# Patient Record
Sex: Male | Born: 1955 | Race: White | Hispanic: No | Marital: Single | Smoking: Current every day smoker
Health system: Southern US, Community
[De-identification: ages and names within clinical notes are randomized; demographics above are authoritative.]

## PROBLEM LIST (undated history)

## (undated) DIAGNOSIS — I10 Essential (primary) hypertension: Secondary | ICD-10-CM

## (undated) HISTORY — DX: Essential (primary) hypertension: I10

---

## 2008-02-24 ENCOUNTER — Emergency Department (HOSPITAL_BASED_OUTPATIENT_CLINIC_OR_DEPARTMENT_OTHER): Admission: EM | Admit: 2008-02-24 | Discharge: 2008-02-24 | Payer: Self-pay | Admitting: Emergency Medicine

## 2013-02-22 ENCOUNTER — Ambulatory Visit (INDEPENDENT_AMBULATORY_CARE_PROVIDER_SITE_OTHER): Payer: BC Managed Care – PPO | Admitting: Family Medicine

## 2013-02-22 VITALS — BP 160/90 | HR 69 | Temp 97.6°F | Resp 16 | Ht 63.5 in | Wt 171.4 lb

## 2013-02-22 DIAGNOSIS — Z87891 Personal history of nicotine dependence: Secondary | ICD-10-CM

## 2013-02-22 DIAGNOSIS — M1710 Unilateral primary osteoarthritis, unspecified knee: Secondary | ICD-10-CM

## 2013-02-22 DIAGNOSIS — I10 Essential (primary) hypertension: Secondary | ICD-10-CM

## 2013-02-22 DIAGNOSIS — M79609 Pain in unspecified limb: Secondary | ICD-10-CM

## 2013-02-22 DIAGNOSIS — M79604 Pain in right leg: Secondary | ICD-10-CM

## 2013-02-22 LAB — POCT UA - MICROSCOPIC ONLY
Bacteria, U Microscopic: NEGATIVE
Crystals, Ur, HPF, POC: NEGATIVE
RBC, urine, microscopic: NEGATIVE
WBC, Ur, HPF, POC: NEGATIVE

## 2013-02-22 LAB — POCT URINALYSIS DIPSTICK
Bilirubin, UA: NEGATIVE
Blood, UA: NEGATIVE
Glucose, UA: NEGATIVE
Nitrite, UA: NEGATIVE
Spec Grav, UA: 1.02

## 2013-02-22 MED ORDER — AMLODIPINE BESYLATE 5 MG PO TABS: 5.0000 mg | ORAL_TABLET | Freq: Every day | ORAL | Status: DC

## 2013-02-22 NOTE — Patient Instructions (Addendum)
Smoking Cessation Quitting smoking is important to your health and has many advantages. However, it is not always easy to quit since nicotine is a very addictive drug. Often times, people try 3 times or more before being able to quit. This document explains the best ways for you to prepare to quit smoking. Quitting takes hard work and a lot of effort, but you can do it. ADVANTAGES OF QUITTING SMOKING  You will live longer, feel better, and live better.  Your body will feel the impact of quitting smoking almost immediately.  Within 20 minutes, blood pressure decreases. Your pulse returns to its normal level.  After 8 hours, carbon monoxide levels in the blood return to normal. Your oxygen level increases.  After 24 hours, the chance of having a heart attack starts to decrease. Your breath, hair, and body stop smelling like smoke.  After 48 hours, damaged nerve endings begin to recover. Your sense of taste and smell improve.  After 72 hours, the body is virtually free of nicotine. Your bronchial tubes relax and breathing becomes easier.  After 2 to 12 weeks, lungs can hold more air. Exercise becomes easier and circulation improves.  The risk of having a heart attack, stroke, cancer, or lung disease is greatly reduced.  After 1 year, the risk of coronary heart disease is cut in half.  After 5 years, the risk of stroke falls to the same as a nonsmoker.  After 10 years, the risk of lung cancer is cut in half and the risk of other cancers decreases significantly.  After 15 years, the risk of coronary heart disease drops, usually to the level of a nonsmoker.  If you are pregnant, quitting smoking will improve your chances of having a healthy baby.  The people you live with, especially any children, will be healthier.  You will have extra money to spend on things other than cigarettes. QUESTIONS TO THINK ABOUT BEFORE ATTEMPTING TO QUIT You may want to talk about your answers with your  caregiver.  Why do you want to quit?  If you tried to quit in the past, what helped and what did not?  What will be the most difficult situations for you after you quit? How will you plan to handle them?  Who can help you through the tough times? Your family? Friends? A caregiver?  What pleasures do you get from smoking? What ways can you still get pleasure if you quit? Here are some questions to ask your caregiver:  How can you help me to be successful at quitting?  What medicine do you think would be best for me and how should I take it?  What should I do if I need more help?  What is smoking withdrawal like? How can I get information on withdrawal? GET READY  Set a quit date.  Change your environment by getting rid of all cigarettes, ashtrays, matches, and lighters in your home, car, or work. Do not let people smoke in your home.  Review your past attempts to quit. Think about what worked and what did not. GET SUPPORT AND ENCOURAGEMENT You have a better chance of being successful if you have help. You can get support in many ways.  Tell your family, friends, and co-workers that you are going to quit and need their support. Ask them not to smoke around you.  Get individual, group, or telephone counseling and support. Programs are available at local hospitals and health centers. Call your local health department for   information about programs in your area.  Spiritual beliefs and practices may help some smokers quit.  Download a "quit meter" on your computer to keep track of quit statistics, such as how long you have gone without smoking, cigarettes not smoked, and money saved.  Get a self-help book about quitting smoking and staying off of tobacco. LEARN NEW SKILLS AND BEHAVIORS  Distract yourself from urges to smoke. Talk to someone, go for a walk, or occupy your time with a task.  Change your normal routine. Take a different route to work. Drink tea instead of coffee.  Eat breakfast in a different place.  Reduce your stress. Take a hot bath, exercise, or read a book.  Plan something enjoyable to do every day. Reward yourself for not smoking.  Explore interactive web-based programs that specialize in helping you quit. GET MEDICINE AND USE IT CORRECTLY Medicines can help you stop smoking and decrease the urge to smoke. Combining medicine with the above behavioral methods and support can greatly increase your chances of successfully quitting smoking.  Nicotine replacement therapy helps deliver nicotine to your body without the negative effects and risks of smoking. Nicotine replacement therapy includes nicotine gum, lozenges, inhalers, nasal sprays, and skin patches. Some may be available over-the-counter and others require a prescription.  Antidepressant medicine helps people abstain from smoking, but how this works is unknown. This medicine is available by prescription.  Nicotinic receptor partial agonist medicine simulates the effect of nicotine in your brain. This medicine is available by prescription. Ask your caregiver for advice about which medicines to use and how to use them based on your health history. Your caregiver will tell you what side effects to look out for if you choose to be on a medicine or therapy. Carefully read the information on the package. Do not use any other product containing nicotine while using a nicotine replacement product.  RELAPSE OR DIFFICULT SITUATIONS Most relapses occur within the first 3 months after quitting. Do not be discouraged if you start smoking again. Remember, most people try several times before finally quitting. You may have symptoms of withdrawal because your body is used to nicotine. You may crave cigarettes, be irritable, feel very hungry, cough often, get headaches, or have difficulty concentrating. The withdrawal symptoms are only temporary. They are strongest when you first quit, but they will go away within  10 14 days. To reduce the chances of relapse, try to:  Avoid drinking alcohol. Drinking lowers your chances of successfully quitting.  Reduce the amount of caffeine you consume. Once you quit smoking, the amount of caffeine in your body increases and can give you symptoms, such as a rapid heartbeat, sweating, and anxiety.  Avoid smokers because they can make you want to smoke.  Do not let weight gain distract you. Many smokers will gain weight when they quit, usually less than 10 pounds. Eat a healthy diet and stay active. You can always lose the weight gained after you quit.  Find ways to improve your mood other than smoking. FOR MORE INFORMATION  www.smokefree.gov  Document Released: 06/10/2001 Document Revised: 12/16/2011 Document Reviewed: 09/25/2011 [employer] Patient Information 2014 [employer]. Hypertension As your heart beats, it forces blood through your arteries. This force is your blood pressure. If the pressure is too high, it is called hypertension (HTN) or high blood pressure. HTN is dangerous because you may have it and not know it. High blood pressure may mean that your heart has to work harder to pump   blood. Your arteries may be narrow or stiff. The extra work puts you at risk for heart disease, stroke, and other problems.  Blood pressure consists of two numbers, a higher number over a lower, 110/72, for example. It is stated as "110 over 72." The ideal is below 120 for the top number (systolic) and under 80 for the bottom (diastolic). Write down your blood pressure today. You should pay close attention to your blood pressure if you have certain conditions such as:  Heart failure.  Prior heart attack.  Diabetes  Chronic kidney disease.  Prior stroke.  Multiple risk factors for heart disease. To see if you have HTN, your blood pressure should be measured while you are seated with your arm held at the level of the heart. It should be measured at least twice. A  one-time elevated blood pressure reading (especially in the Emergency Department) does not mean that you need treatment. There may be conditions in which the blood pressure is different between your right and left arms. It is important to see your caregiver soon for a recheck. Most people have essential hypertension which means that there is not a specific cause. This type of high blood pressure may be lowered by changing lifestyle factors such as:  Stress.  Smoking.  Lack of exercise.  Excessive weight.  Drug/tobacco/alcohol use.  Eating less salt. Most people do not have symptoms from high blood pressure until it has caused damage to the body. Effective treatment can often prevent, delay or reduce that damage. TREATMENT  When a cause has been identified, treatment for high blood pressure is directed at the cause. There are a large number of medications to treat HTN. These fall into several categories, and your caregiver will help you select the medicines that are best for you. Medications may have side effects. You should review side effects with your caregiver. If your blood pressure stays high after you have made lifestyle changes or started on medicines,   Your medication(s) may need to be changed.  Other problems may need to be addressed.  Be certain you understand your prescriptions, and know how and when to take your medicine.  Be sure to follow up with your caregiver within the time frame advised (usually within two weeks) to have your blood pressure rechecked and to review your medications.  If you are taking more than one medicine to lower your blood pressure, make sure you know how and at what times they should be taken. Taking two medicines at the same time can result in blood pressure that is too low. SEEK IMMEDIATE MEDICAL CARE IF:  You develop a severe headache, blurred or changing vision, or confusion.  You have unusual weakness or numbness, or a faint feeling.  You  have severe chest or abdominal pain, vomiting, or breathing problems. MAKE SURE YOU:   Understand these instructions.  Will watch your condition.  Will get help right away if you are not doing well or get worse. Document Released: 06/16/2005 Document Revised: 09/08/2011 Document Reviewed: 02/04/2008 [facility] Patient Information 2014 [place], [place].  Follow up [facility] for further evaluation of your hypertension and to rule out claudication.  You are being started on Norvasc today for your blood pressure.  Take one a day as directed.    Stop smoking  Call [phone] for a follow up appointment to arrange for a primary care doctor here.  Return to the urgent care for any new or worrisome symptoms.

## 2013-02-22 NOTE — Progress Notes (Signed)
  Subjective:    Patient ID: Carl Spears, male    DOB: 1955/08/16, 57 y.o.   MRN: [id number]  HPI This is a 57 yo male with complaint of bilateral leg pain, onset in April with left leg and July with right leg.  Left leg not as severe anymore.  Patient states pain is worse in right lower extremity currently.  Pain worse when up and ambulating.  Occasionally awakens him at night as well.  No fever or chills.  Denies significant swelling.  No injury.  Pain in lower legs from just below knees to ankle.  Pain relieved with elevation and when he wrapped it with ACE wrap.    Has not seen MD in years.  Denies significant PMH.  History reviewed. No pertinent past medical history. History reviewed. No pertinent past surgical history. No Known Allergies    Review of Systems  Constitutional: Negative for fever and chills.  HENT: Negative for neck pain and neck stiffness.   Eyes: Negative for visual disturbance.  Respiratory: Negative for cough, shortness of breath and wheezing.   Cardiovascular: Negative for chest pain, palpitations and leg swelling.  Gastrointestinal: Negative for nausea, vomiting and abdominal pain.  Endocrine: Negative for heat intolerance.  Musculoskeletal: Positive for arthralgias. Negative for back pain and gait problem.  Skin: Negative for color change, pallor, rash and wound.  Neurological: Negative for weakness, light-headedness and headaches.  Hematological: Does not bruise/bleed easily.   Patient has right knee pain, chronic mild pain worse with ambulation.  Different than current complaints.     Objective:   Physical Exam Blood pressure 160/90, pulse 69, temperature 97.6 F (36.4 C), temperature source Oral, resp. rate 16, height 5' 3.5" (1.613 m), weight 171 lb 6.4 oz (77.747 kg), SpO2 98.00%. Body mass index is 29.88 kg/(m^2). Well-developed, well nourished male who is awake, alert and oriented, in NAD. HEENT: [place]/AT, PERRL, EOMI.  Sclera and conjunctiva are  clear. OP is clear. Neck: supple, non-tender, no lymphadenopathy, thyromegaly. Heart: RRR, no murmur Lungs: normal effort, CTA Abdomen: normo-active bowel sounds, supple, non-tender, no mass or organomegaly. Extremities: no cyanosis, clubbing or edema. Mild 1+ edema bilateral lower extremities below knees.  Palpable DP pulses bilaterally.  Negative Homan's sign bilaterally. Right knee with mild joint line tenderness with mild patellar crepitus. Skin: warm and dry without rash, no erythema. Psychologic: good mood and appropriate affect, normal speech and behavior.  EKG:  NSR at 60, Normal axis, normal intervals, no ST T changes. Interpretation:  No acute ischemia.      Assessment & Plan:  Newly diagnoses hypertension.  Will check SMA7, UA and TSH.  Start on daily Norvasc.  Refer patient to [facility] cardiology for further evaluation to rule out claudication/peripheral vascular disease.  Follow up at [facility] for routine follow and arrange for PCP.  Probable early osteoarthritis right knee, advised patient that this may worsen, although this does not appear to be the underlying cause of his current pain.  Recommend smoking cessation.

## 2013-02-23 LAB — COMPREHENSIVE METABOLIC PANEL
ALT: 20 U/L (ref 0–53)
Albumin: 4.6 g/dL (ref 3.5–5.2)
CO2: 22 mEq/L (ref 19–32)
Calcium: 9.7 mg/dL (ref 8.4–10.5)
Chloride: 105 mEq/L (ref 96–112)
Glucose, Bld: 83 mg/dL (ref 70–99)
Potassium: 4.1 mEq/L (ref 3.5–5.3)
Sodium: 139 mEq/L (ref 135–145)
Total Bilirubin: 0.4 mg/dL (ref 0.3–1.2)
Total Protein: 6.7 g/dL (ref 6.0–8.3)

## 2013-02-24 ENCOUNTER — Telehealth: Payer: Self-pay

## 2013-02-24 NOTE — Progress Notes (Signed)
History and physical exam reviewed in detail with Dr. Lorri Frederick.  EKG reviewed.  Labs reviewed.  Agree with A/P.

## 2013-02-24 NOTE — Telephone Encounter (Signed)
Pt is returning call about labs, he states that he cannot take calls until 2:45 when he gets off of work. Best#[phone]

## 2013-02-25 NOTE — Telephone Encounter (Signed)
French Ana spoke pt advised lab results.

## 2013-03-01 ENCOUNTER — Ambulatory Visit: Payer: Self-pay | Admitting: Cardiovascular Disease

## 2013-03-02 ENCOUNTER — Encounter: Payer: Self-pay | Admitting: Cardiovascular Disease

## 2013-03-02 ENCOUNTER — Ambulatory Visit (INDEPENDENT_AMBULATORY_CARE_PROVIDER_SITE_OTHER): Payer: BC Managed Care – PPO | Admitting: Cardiovascular Disease

## 2013-03-02 VITALS — BP 128/80 | HR 88 | Ht 66.0 in | Wt 170.3 lb

## 2013-03-02 DIAGNOSIS — F17200 Nicotine dependence, unspecified, uncomplicated: Secondary | ICD-10-CM

## 2013-03-02 DIAGNOSIS — I10 Essential (primary) hypertension: Secondary | ICD-10-CM

## 2013-03-02 DIAGNOSIS — Z720 Tobacco use: Secondary | ICD-10-CM

## 2013-03-02 NOTE — Progress Notes (Signed)
Scheduled appointment for 04/21/13 @ 3:15pm with Dr Audria Nine.

## 2013-03-02 NOTE — Patient Instructions (Addendum)
Decrease Amlodipine to 2.5mg  daily (1/2 tablet).  Your physician recommends that you schedule a follow-up appointment in: 2 months.  Exercise to Stay Healthy Exercise helps you become and stay healthy. EXERCISE IDEAS AND TIPS Choose exercises that:  You enjoy.  Fit into your day. You do not need to exercise really hard to be healthy. You can do exercises at a slow or medium level and stay healthy. You can:  Stretch before and after working out.  Try yoga, Pilates, or tai chi.  Lift weights.  Walk fast, swim, jog, run, climb stairs, bicycle, dance, or rollerskate.  Take aerobic classes. Exercises that burn about 150 calories:  Running 1  miles in 15 minutes.  Playing volleyball for 45 to 60 minutes.  Washing and waxing a car for 45 to 60 minutes.  Playing touch football for 45 minutes.  Walking 1  miles in 35 minutes.  Pushing a stroller 1  miles in 30 minutes.  Playing basketball for 30 minutes.  Raking leaves for 30 minutes.  Bicycling 5 miles in 30 minutes.  Walking 2 miles in 30 minutes.  Dancing for 30 minutes.  Shoveling snow for 15 minutes.  Swimming laps for 20 minutes.  Walking up stairs for 15 minutes.  Bicycling 4 miles in 15 minutes.  Gardening for 30 to 45 minutes.  Jumping rope for 15 minutes.  Washing windows or floors for 45 to 60 minutes. Document Released: 07/19/2010 Document Revised: 09/08/2011 Document Reviewed: 07/19/2010 [employer] Patient Information 2014 [employer], [place]. 1.5 Gram Low Sodium Diet A 1.5 gram sodium diet restricts the amount of sodium in the diet to no more than 1.5 g or 1500 mg daily. The American Heart Association recommends Americans over the age of 71 to consume no more than 1500 mg of sodium each day to reduce the risk of developing high blood pressure. Research also shows that limiting sodium may reduce heart attack and stroke risk. Many foods contain sodium for flavor and sometimes as a preservative. When the  amount of sodium in a diet needs to be low, it is important to know what to look for when choosing foods and drinks. The following includes some information and guidelines to help make it easier for you to adapt to a low sodium diet. QUICK TIPS  Do not add salt to food.  Avoid convenience items and fast food.  Choose unsalted snack foods.  Buy lower sodium products, often labeled as "lower sodium" or "no salt added."  Check food labels to learn how much sodium is in 1 serving.  When eating at a restaurant, ask that your food be prepared with less salt or none, if possible. READING FOOD LABELS FOR SODIUM INFORMATION The nutrition facts label is a good place to find how much sodium is in foods. Look for products with no more than 400 mg of sodium per serving. Remember that 1.5 g = 1500 mg. The food label may also list foods as:  Sodium-free: Less than 5 mg in a serving.  Very low sodium: 35 mg or less in a serving.  Low-sodium: 140 mg or less in a serving.  Light in sodium: 50% less sodium in a serving. For example, if a food that usually has 300 mg of sodium is changed to become light in sodium, it will have 150 mg of sodium.  Reduced sodium: 25% less sodium in a serving. For example, if a food that usually has 400 mg of sodium is changed to reduced sodium, it will  have 300 mg of sodium. CHOOSING FOODS Grains  Avoid: Salted crackers and snack items. Some cereals, including instant hot cereals. Bread stuffing and biscuit mixes. Seasoned rice or pasta mixes.  Choose: Unsalted snack items. Low-sodium cereals, oats, puffed wheat and rice, shredded wheat. English muffins and bread. Pasta. Meats  Avoid: Salted, canned, smoked, spiced, pickled meats, including fish and poultry. Bacon, ham, sausage, cold cuts, hot dogs, anchovies.  Choose: Low-sodium canned tuna and salmon. Fresh or frozen meat, poultry, and fish. Dairy  Avoid: Processed cheese and spreads. Cottage cheese. Buttermilk  and condensed milk. Regular cheese.  Choose: Milk. Low-sodium cottage cheese. Yogurt. Sour cream. Low-sodium cheese. Fruits and Vegetables  Avoid: Regular canned vegetables. Regular canned tomato sauce and paste. Frozen vegetables in sauces. Olives. Rosita Fire. Relishes. Sauerkraut.  Choose: Low-sodium canned vegetables. Low-sodium tomato sauce and paste. Frozen or fresh vegetables. Fresh and frozen fruit. Condiments  Avoid: Canned and packaged gravies. Worcestershire sauce. Tartar sauce. Barbecue sauce. Soy sauce. Steak sauce. Ketchup. Onion, garlic, and table salt. Meat flavorings and tenderizers.  Choose: Fresh and dried herbs and spices. Low-sodium varieties of mustard and ketchup. Lemon juice. Tabasco sauce. Horseradish. SAMPLE 1.5 GRAM SODIUM MEAL PLAN Breakfast / Sodium (mg)  1 cup low-fat milk / 143 mg  1 whole-wheat English muffin / 240 mg  1 tbs heart-healthy margarine / 153 mg  1 hard-boiled egg / 139 mg  1 small orange / 0 mg Lunch / Sodium (mg)  1 cup raw carrots / 76 mg  2 tbs no salt added peanut butter / 5 mg  2 slices whole-wheat bread / 270 mg  1 tbs jelly / 6 mg   cup red grapes / 2 mg Dinner / Sodium (mg)  1 cup whole-wheat pasta / 2 mg  1 cup low-sodium tomato sauce / 73 mg  3 oz lean ground beef / 57 mg  1 small side salad (1 cup raw spinach leaves,  cup cucumber,  cup yellow bell pepper) with 1 tsp olive oil and 1 tsp red wine vinegar / 25 mg Snack / Sodium (mg)  1 container low-fat vanilla yogurt / 107 mg  3 graham cracker squares / 127 mg Nutrient Analysis  Calories: 1745  Protein: 75 g  Carbohydrate: 237 g  Fat: 57 g  Sodium: 1425 mg Document Released: 06/16/2005 Document Revised: 09/08/2011 Document Reviewed: 09/17/2009 [employer] Patient Information 2014 [place], [place].

## 2013-03-03 ENCOUNTER — Encounter: Payer: Self-pay | Admitting: Cardiovascular Disease

## 2013-03-03 DIAGNOSIS — Z720 Tobacco use: Secondary | ICD-10-CM | POA: Insufficient documentation

## 2013-03-03 DIAGNOSIS — I10 Essential (primary) hypertension: Secondary | ICD-10-CM | POA: Insufficient documentation

## 2013-03-03 MED ORDER — AMLODIPINE BESYLATE 5 MG PO TABS: 2.5000 mg | ORAL_TABLET | Freq: Every day | ORAL | Status: DC

## 2013-03-03 NOTE — Assessment & Plan Note (Signed)
The patient may indeed have systemic hypertension. There is a family history of high blood pressure. He is at an age where it is quite possible to have onset of essential hypertension. There are several things that he can do to reduce the odds of complications from high blood pressure such as reducing his sodium rich diet and beginning to exercise again and losing weight. It should be noted that when his blood pressure was recorded as high he was taking nonsteroidal anti-inflammatory drugs twice a day and was also in pain. It is possible that his blood pressure will be much better after he stops taking NSAIDs and implements lifestyle changes. There is even a chance he may not require medication at all. I have recommended that he reduce the dose of amlodipine to 2.5 mg daily since his blood pressure is great today. I'm not sure how to associate the new onset of blood pressure and his leg pain. It may be a simple, stenosis, or as stated above the pain may have worsened his blood pressure. I believe his leg pain has the hallmarks of musculoskeletal pain, possibly refer back pain. Since it occurred at rest and was not exacerbated by walking, it does not sound like arterial insufficiency/intermittent claudication. He also does not have the typical features nor the physical findings of peripheral venous insufficiency.

## 2013-03-03 NOTE — Assessment & Plan Note (Signed)
I have advised him to quit smoking. We spent quite a bit of time discussing the benefits of smoking cessation and the possible complications related to chronic cigarette use. He has gone without smoking cigarettes for long period of time in the past and I am convinced that he can stop smoking altogether if she really sets his mind to it.

## 2013-03-03 NOTE — Progress Notes (Signed)
Patient ID: Carl Spears, male   DOB: December 30, 1955, 57 y.o.   MRN: [id number]      Reason for office visit Blood pain, hypertension  Carl Spears is a previously healthy 57 year old gentleman who recently had problems with leg pain and has been newly diagnosed with high blood pressure. He initially had unilateral leg pain that occurred while standing or sitting. It occurred in the left leg and started in April . He describes it as severe. He later had even worse pain that started in July and affected the opposite limb. He was taking over-the-counter Aleve twice a day without much relief. He notes that elevating the legs and wrapping them with Ace bandages helped . He did not have any associated edema . He went to urgent care for evaluation and was found to have a blood pressure 160/90 millimeters Hg. He was subsequently referred here for followup and started on amlodipine 5 mg daily. It has now been roughly 2 weeks and his pain has resolved completely. He is now essentially asymptomatic.  He has a relatively high sodium diet. He does eat canned soups and a lot of take out [place] food. He used to bike and exercise at the gym but recently has become very sedentary he has about 15 drinks a week. He smokes about a quarter pack of cigarettes a day, recently more. 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    No Known Allergies  Current Outpatient Prescriptions  Medication Sig Dispense Refill  . amLODipine (NORVASC) 5 MG tablet Take 0.5 tablets (2.5 mg total) by mouth daily.  30 tablet  3  . ibuprofen (ADVIL,MOTRIN) 200 MG tablet Take 200 mg by mouth every 6 (six) hours as needed for pain.       No current facility-administered medications for this visit.    Past Medical History  Diagnosis Date  . Hypertension     No past surgical history on file.  Family History  Problem Relation Age of Onset  . Hypertension Father     History   Social History  . Marital Status: Single    Spouse Name: N/A   Number of Children: N/A  . Years of Education: N/A   Occupational History  . Not on file.   Social History Main Topics  . Smoking status: Current Every Day Smoker -- 0.25 packs/day  . Smokeless tobacco: Not on file  . Alcohol Use: 7.0 oz/week    14 drink(s) per week  . Drug Use: No  . Sexual Activity: Not on file   Other Topics Concern  . Not on file   Social History Narrative  . No narrative on file    Review of systems: The patient specifically denies any chest pain at rest or with exertion, dyspnea at rest or with exertion, orthopnea, paroxysmal nocturnal dyspnea, syncope, palpitations, focal neurological deficits,  lower extremity edema, unexplained weight gain, cough, hemoptysis or wheezing.  The patient also denies abdominal pain, nausea, vomiting, dysphagia, diarrhea, constipation, polyuria, polydipsia, dysuria, hematuria, frequency, urgency, abnormal bleeding or bruising, fever, chills, unexpected weight changes, mood swings, change in skin or hair texture, change in voice quality, auditory or visual problems, allergic reactions or rashes, new musculoskeletal complaints other than those described above   PHYSICAL EXAM BP 128/80  Pulse 88  Ht 5\' 6"  (1.676 m)  Wt 170 lb 4.8 oz (77.248 kg)  BMI 27.5 kg/m2  General: Alert, oriented x3, no distress Head: no evidence of trauma, PERRL, EOMI, no exophtalmos or lid  lag, no myxedema, no xanthelasma; normal ears, nose and oropharynx Neck: normal jugular venous pulsations and no hepatojugular reflux; brisk carotid pulses without delay and no carotid bruits Chest: clear to auscultation, no signs of consolidation by percussion or palpation, normal fremitus, symmetrical and full respiratory excursions Cardiovascular: normal position and quality of the apical impulse, regular rhythm, normal first and second heart sounds, no murmurs, rubs or gallops Abdomen: no tenderness or distention, no masses by palpation, no abnormal pulsatility or  arterial bruits, normal bowel sounds, no hepatosplenomegaly Extremities: no clubbing, cyanosis or edema; 2+ radial, ulnar and brachial pulses bilaterally; 2+ right femoral, posterior tibial and dorsalis pedis pulses; 2+ left femoral, posterior tibial and dorsalis pedis pulses; no subclavian or femoral bruits. Pedal pulses are bounding and can be easily felt even through thick socks. Neurological: grossly nonfocal   EKG: Normal sinus rhythm, normal trace  Lipid Panel  No results found for this basename: chol, trig, hdl, cholhdl, vldl, ldlcalc    BMET    Component Value Date/Time   NA 139 02/22/2013 1828   K 4.1 02/22/2013 1828   CL 105 02/22/2013 1828   CO2 22 02/22/2013 1828   GLUCOSE 83 02/22/2013 1828   BUN 17 02/22/2013 1828   CREATININE 0.81 02/22/2013 1828   CALCIUM 9.7 02/22/2013 1828     ASSESSMENT AND PLAN HTN (hypertension)  The patient may indeed have systemic hypertension. There is a family history of high blood pressure. He is at an age where it is quite possible to have onset of essential hypertension. There are several things that he can do to reduce the odds of complications from high blood pressure such as reducing his sodium rich diet and beginning to exercise again and losing weight. It should be noted that when his blood pressure was recorded as high he was taking nonsteroidal anti-inflammatory drugs twice a day and was also in pain. It is possible that his blood pressure will be much better after he stops taking NSAIDs and implements lifestyle changes. There is even a chance he may not require medication at all. I have recommended that he reduce the dose of amlodipine to 2.5 mg daily since his blood pressure is great today. I'm not sure how to associate the new onset of blood pressure and his leg pain. It may be a simple, stenosis, or as stated above the pain may have worsened his blood pressure. I believe his leg pain has the hallmarks of musculoskeletal pain, possibly refer  back pain. Since it occurred at rest and was not exacerbated by walking, it does not sound like arterial insufficiency/intermittent claudication. He also does not have the typical features nor the physical findings of peripheral venous insufficiency.  Tobacco abuse I have advised him to quit smoking. We spent quite a bit of time discussing the benefits of smoking cessation and the possible complications related to chronic cigarette use. He has gone without smoking cigarettes for long period of time in the past and I am convinced that he can stop smoking altogether if she really sets his mind to it.  No orders of the defined types were placed in this encounter.   Meds ordered this encounter  Medications  . ibuprofen (ADVIL,MOTRIN) 200 MG tablet    Sig: Take 200 mg by mouth every 6 (six) hours as needed for pain.  Marland Kitchen amLODipine (NORVASC) 5 MG tablet    Sig: Take 0.5 tablets (2.5 mg total) by mouth daily.    Dispense:  30 tablet  Refill:  Hartford Marquesha Robideau, MD, [place] [phone] office [phone] pager

## 2013-04-21 ENCOUNTER — Ambulatory Visit: Payer: BC Managed Care – PPO

## 2013-04-21 ENCOUNTER — Encounter: Payer: Self-pay | Admitting: Family Medicine

## 2013-04-21 ENCOUNTER — Ambulatory Visit (INDEPENDENT_AMBULATORY_CARE_PROVIDER_SITE_OTHER): Payer: BC Managed Care – PPO | Admitting: Family Medicine

## 2013-04-21 VITALS — BP 132/84 | HR 88 | Temp 98.4°F | Resp 16 | Ht 65.0 in | Wt 166.8 lb

## 2013-04-21 DIAGNOSIS — M1710 Unilateral primary osteoarthritis, unspecified knee: Secondary | ICD-10-CM | POA: Insufficient documentation

## 2013-04-21 DIAGNOSIS — M25569 Pain in unspecified knee: Secondary | ICD-10-CM

## 2013-04-21 MED ORDER — TRAMADOL HCL 50 MG PO TABS: ORAL_TABLET | ORAL | Status: AC

## 2013-04-21 MED ORDER — CELECOXIB 200 MG PO CAPS: ORAL_CAPSULE | ORAL | Status: AC

## 2013-04-21 NOTE — Progress Notes (Signed)
S:  This 57 y.o. Male is here for evaluation of bilat knee pain. He was seen at [facility] 2 months ago; at that time, he was diagnosed with HTN and advised that he had early OA of painful knee. Pt was referred to cardiology; Dr. Royann Shivers advised pt that he had no cardiovascular disease and really did not need to take anti-hypertensive. There was no concern for PAD; pt did smoke for 5 years in emote past. Pt weaned off Amlodipine and is not on any medication for BP.  His main concern is bilateral knee pain, alternating between knees but worse on R side. 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 He has pain upon arising from prolonged sitting. He cannot stoop or bend his knees w/o discomfort. He has crepitus. Pain feels like it is in the bone. He stopped taking Ibuprofen; it was ineffective. He has not tried topical analgesics. No other joints are involved. Pt denies redness or significant swelling. He has not had fatigue, fever, rashes, sore throat, arthralgias, weakness, edema.   O: Filed Vitals:   04/21/13 1524  BP: 132/84  Pulse: 88  Temp: 98.4 F (36.9 C)  Resp: 16   GEN: In NAD; WN,WD. HENT: /AT; EOMI w/ mildly injected conj and clear sclerae. Otherwise unremarkable. COR: RRR. No edema. LUNGS: Normal resp rate and effort. BACK: No CVAT; spine straight w/o deformity or bony tenderness. MS: Knees- degenerative deformity bilaterally; good ROM but stiff and mild crepitus noted. Small effusion medially on L. Tenderness at tibial plateau bilaterally. Gait is antalgic. NEURO: A&O x 3; CNs intact. Motor and sensory function intact.   [facility] reading (PRIMARY) by  Dr. Audria Nine: R and L knees- very early degenerative changes; joint spaces well preserved. No fracture or dislocation.   A/P: Knee pain, unspecified laterality - RX: Celebrex 200 mg 1 capsule daily. RX: Tramadol 50 mg 1 tab hs prn severe pain.  Pt to go to [employer] and purchase footwear to help  reduce leg and knee pain. Continue to use topical analgesic. Encouraged healthier nutrition and alcohol (beer) reduction; pt admits drinking 6-pk x 2 on weekends. He has concerns about long term consequences of alcohol consumption. Advise OTC Vit D3 supplement 2000 IU daily.  Plan: DG Knee 1-2 Views Left, DG Knee 1-2 Views Right

## 2013-04-21 NOTE — Patient Instructions (Addendum)
Osteoarthritis Osteoarthritis is the most common form of arthritis. It is redness, soreness, and swelling (inflammation) affecting the cartilage. Cartilage acts as a cushion, covering the ends of bones where they meet to form a joint. CAUSES  Over time, the cartilage begins to wear away. This causes bone to rub on bone. This produces pain and stiffness in the affected joints. Factors that contribute to this problem are:  Excessive body weight.  Age.  Overuse of joints. SYMPTOMS   People with osteoarthritis usually experience joint pain, swelling, or stiffness.  Over time, the joint may lose its normal shape.  Small deposits of bone (osteophytes) may grow on the edges of the joint.  Bits of bone or cartilage can break off and float inside the joint space. This may cause more pain and damage.  Osteoarthritis can lead to depression, anxiety, feelings of helplessness, and limitations on daily activities. The most commonly affected joints are in the:  Ends of the fingers.  Thumbs.  Neck.  Lower back.  Knees.  Hips. DIAGNOSIS  Diagnosis is mostly based on your symptoms and exam. Tests may be helpful, including:  X-rays of the affected joint.  A computerized magnetic scan (MRI).  Blood tests to rule out other types of arthritis.  Joint fluid tests. This involves using a needle to draw fluid from the joint and examining the fluid under a microscope. TREATMENT  Goals of treatment are to control pain, improve joint function, maintain a normal body weight, and maintain a healthy lifestyle. Treatment approaches may include:  A prescribed exercise program with rest and joint relief.  Weight control with nutritional education.  Pain relief techniques such as:  Properly applied heat and cold.  Electric pulses delivered to nerve endings under the skin (transcutaneous electrical nerve stimulation, TENS).  Massage.  Certain supplements. Ask your caregiver before using any  supplements, especially in combination with prescribed drugs.  Medicines to control pain, such as:  Acetaminophen.  Nonsteroidal anti-inflammatory drugs (NSAIDs), such as naproxen.  Narcotic or central-acting agents, such as tramadol. This drug carries a risk of addiction and is generally prescribed for short-term use.  Corticosteroids. These can be given orally or as injection. This is a short-term treatment, not recommended for routine use.  Surgery to reposition the bones and relieve pain (osteotomy) or to remove loose pieces of bone and cartilage. Joint replacement may be needed in advanced states of osteoarthritis. HOME CARE INSTRUCTIONS  Your caregiver can recommend specific types of exercise. These may include:  Strengthening exercises. These are done to strengthen the muscles that support joints affected by arthritis. They can be performed with weights or with exercise bands to add resistance.  Aerobic activities. These are exercises, such as brisk walking or low-impact aerobics, that get your heart pumping. They can help keep your lungs and circulatory system in shape.  Range-of-motion activities. These keep your joints limber.  Balance and agility exercises. These help you maintain daily living skills. Learning about your condition and being actively involved in your care will help improve the course of your osteoarthritis. SEEK MEDICAL CARE IF:   You feel hot or your skin turns red.  You develop a rash in addition to your joint pain.  You have an oral temperature above 102 F (38.9 C). FOR MORE INFORMATION  National Institute of Arthritis and Musculoskeletal and Skin Diseases: www.niams.http://www.myers.net/ [employer] on Aging: https://walker.com/ American College of Rheumatology: www.rheumatology.org Document Released: 06/16/2005 Document Revised: 09/08/2011 Document Reviewed: 09/27/2009 [facility] Patient Information 2014 [place], [place].  I have advised you to check at [employer] and ask about good footwear that will help reduce your leg and knee pain while at work.  Get over-the-counter Vitamin D3  2000 IU and take 1 capsule or tablet daily.  The Mediterranean Diet is a good guide for ow to eat healthy; I have given you some information about anti-inflammatory foods.  Tart cherry juice helps reduce joint pain. Spices such as tumeric, ginger and garlic help also.

## 2013-04-25 ENCOUNTER — Telehealth: Payer: Self-pay

## 2013-04-25 NOTE — Telephone Encounter (Signed)
Received Prior Auth req for Celebrex Rx. According to [facility] form, pt has to have tried/failed at least two other covered NSAIDS. It looks like from OV notes from last two OVs, pt has only tried ibuprofen, so coverage for Celebrex will be denied. Dr Audria Nine, do you want to Rx something else?

## 2013-04-26 MED ORDER — MELOXICAM 15 MG PO TABS: ORAL_TABLET | ORAL | Status: AC

## 2013-04-26 NOTE — Telephone Encounter (Signed)
I prescribed Meloxicam 15 mg 1 tablet once daily w/ food or snack. This med should be covered by is insurer.

## 2013-04-26 NOTE — Telephone Encounter (Signed)
Pt called to check status and I advised him that ins will not cover the Celebrex but I have sent message to Dr Audria Nine to send in a different medication and we will notify him when done.

## 2013-04-27 NOTE — Telephone Encounter (Signed)
Notified pt. 

## 2013-05-02 ENCOUNTER — Ambulatory Visit: Payer: BC Managed Care – PPO | Admitting: Cardiovascular Disease

## 2013-06-06 ENCOUNTER — Other Ambulatory Visit: Payer: Self-pay | Admitting: Family Medicine

## 2013-07-22 ENCOUNTER — Ambulatory Visit: Payer: BC Managed Care – PPO | Admitting: Family Medicine

## 2014-01-17 IMAGING — CR DG KNEE 1-2V*L*
2 series · 2 of 2 positions shown · non-contrast
Comparison: Concurrently obtained radiographs of the contralateral
right knee

CLINICAL DATA: Knee pain

EXAM:
LEFT KNEE - 1-2 VIEW

[lateral]
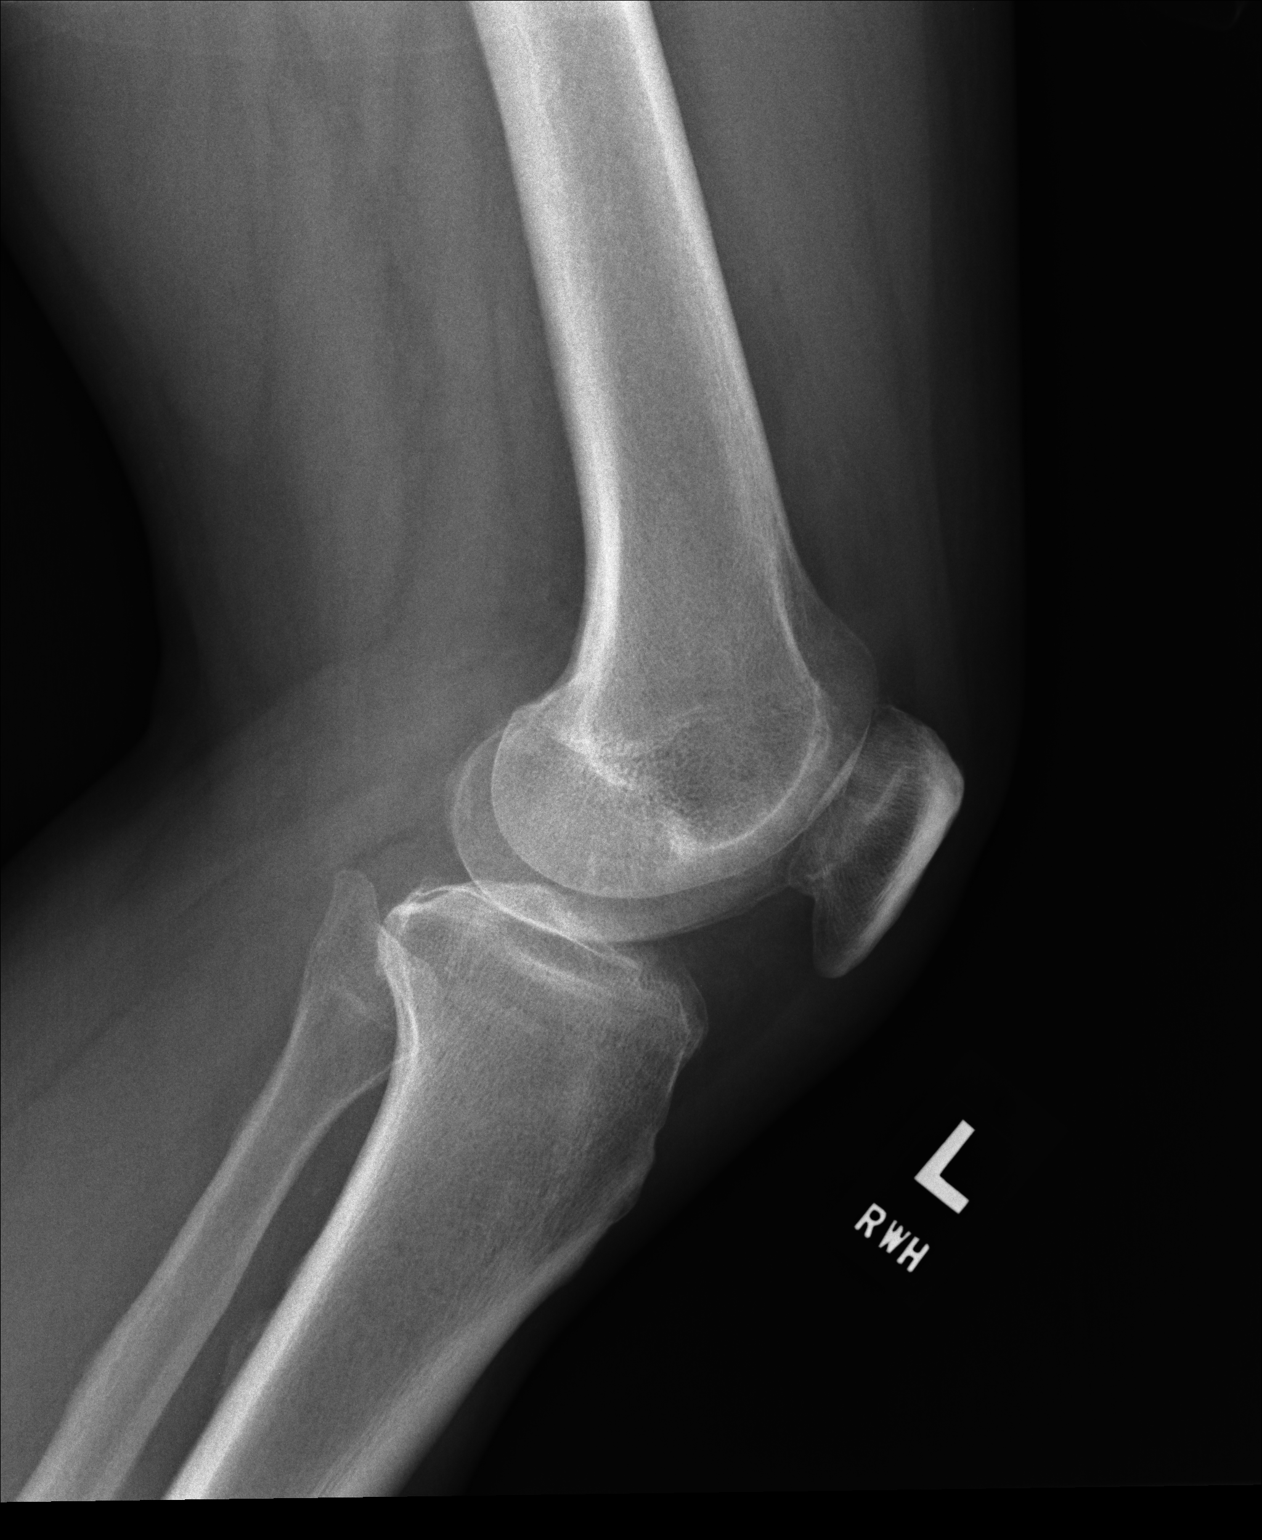

[AP]
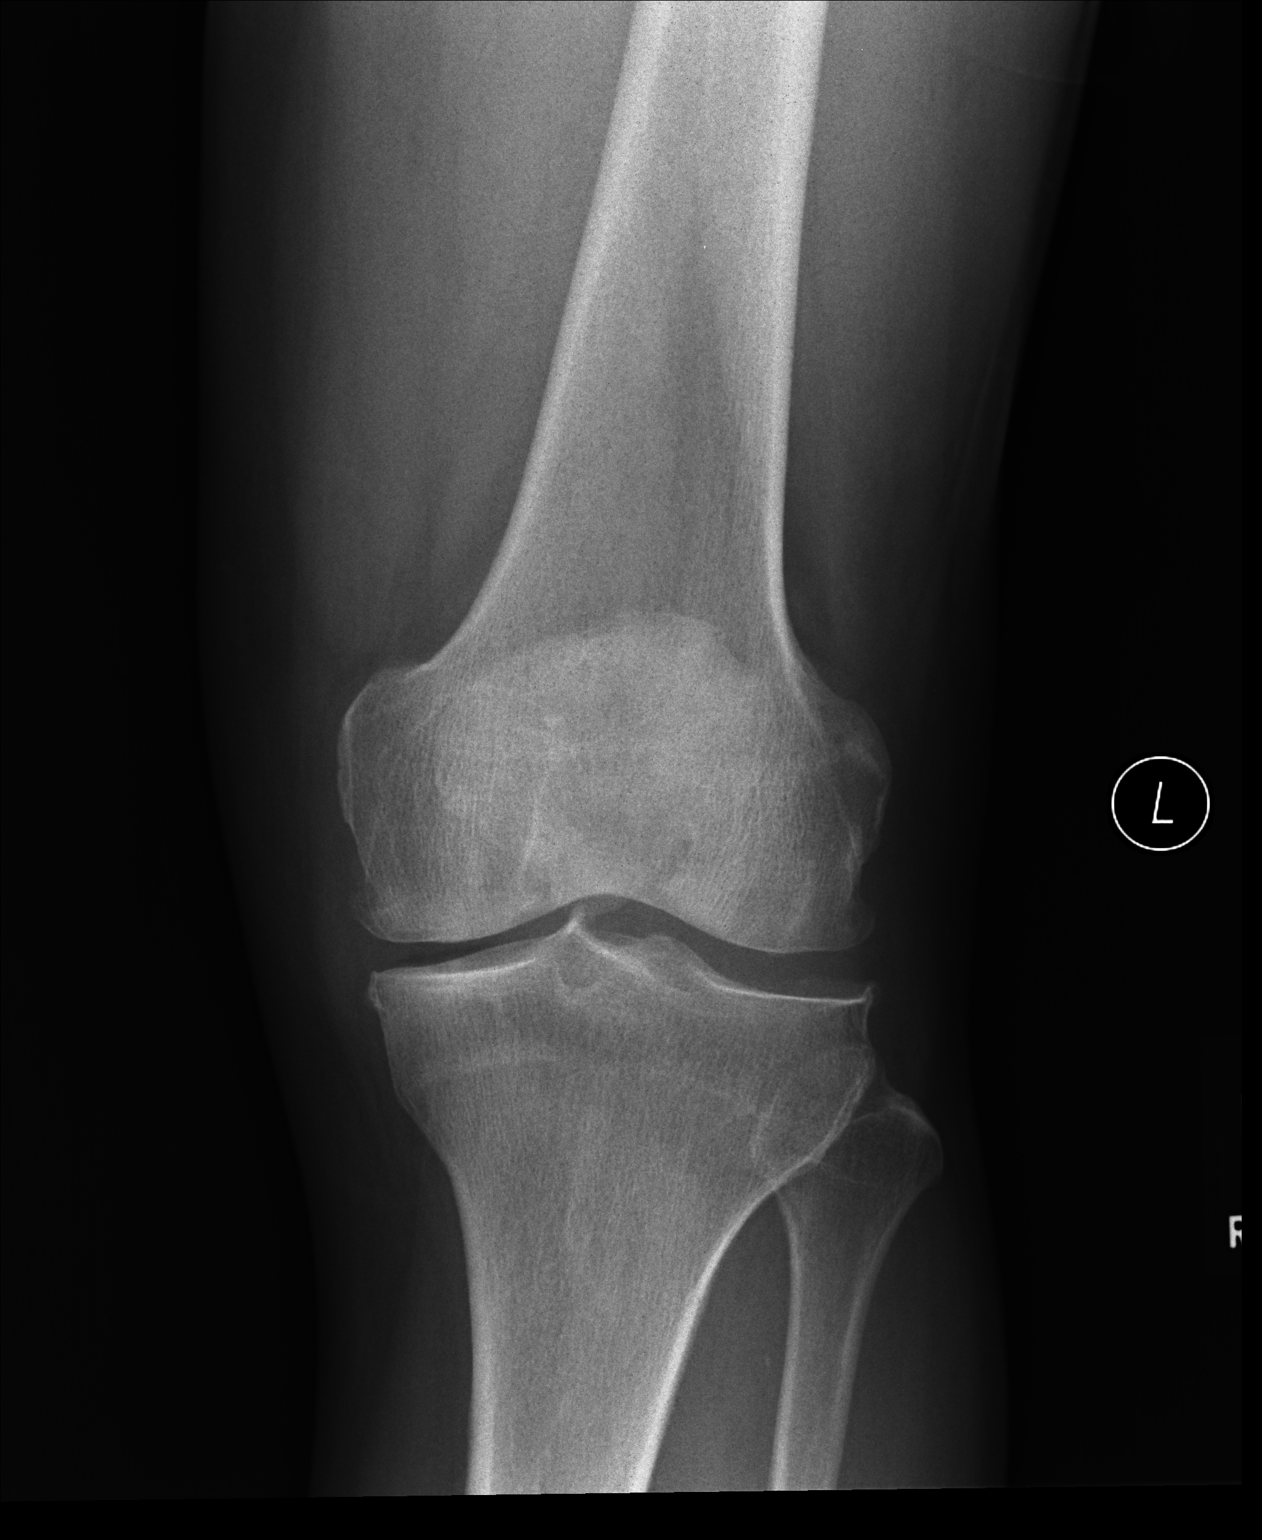

[2 of 2 positions shown; findings below may reference images not displayed]

FINDINGS: No acute fracture or malalignment. Small suprapatellar joint
effusion. Mild tricompartmental degenerative changes including
narrowing of the medial compartment, peaking of the tibial spines
and early osteophyte formation. Bony mineralization is within normal
limits. Unremarkable soft tissues.
IMPRESSION: Mild tricompartmental osteoarthritis most significant in the medial
compartment.

Small, likely degenerative knee joint effusion.

## 2014-01-17 IMAGING — CR DG KNEE 1-2V*R*
2 series · 2 of 2 positions shown · non-contrast
Comparison: Concurrently obtained radiographs of the contralateral
left knee

CLINICAL DATA: Knee pain

EXAM:
RIGHT KNEE - 1-2 VIEW

[AP]
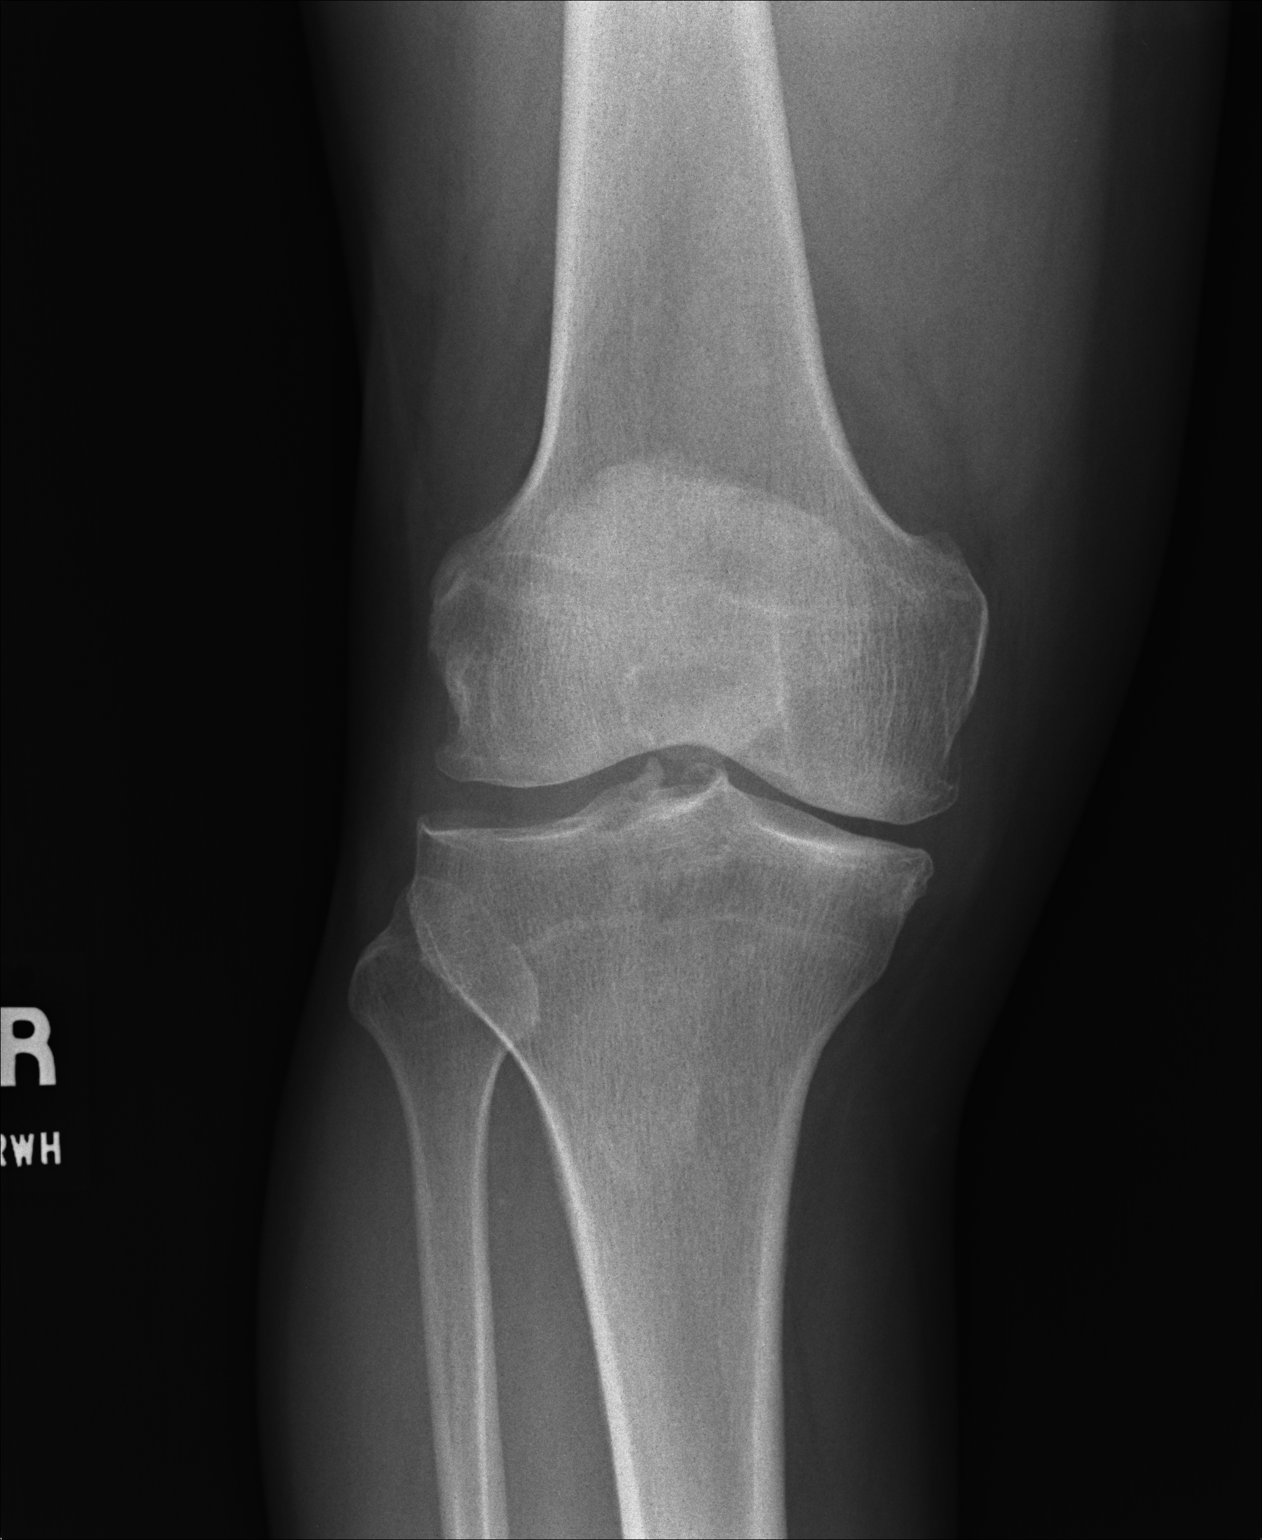

[lateral]
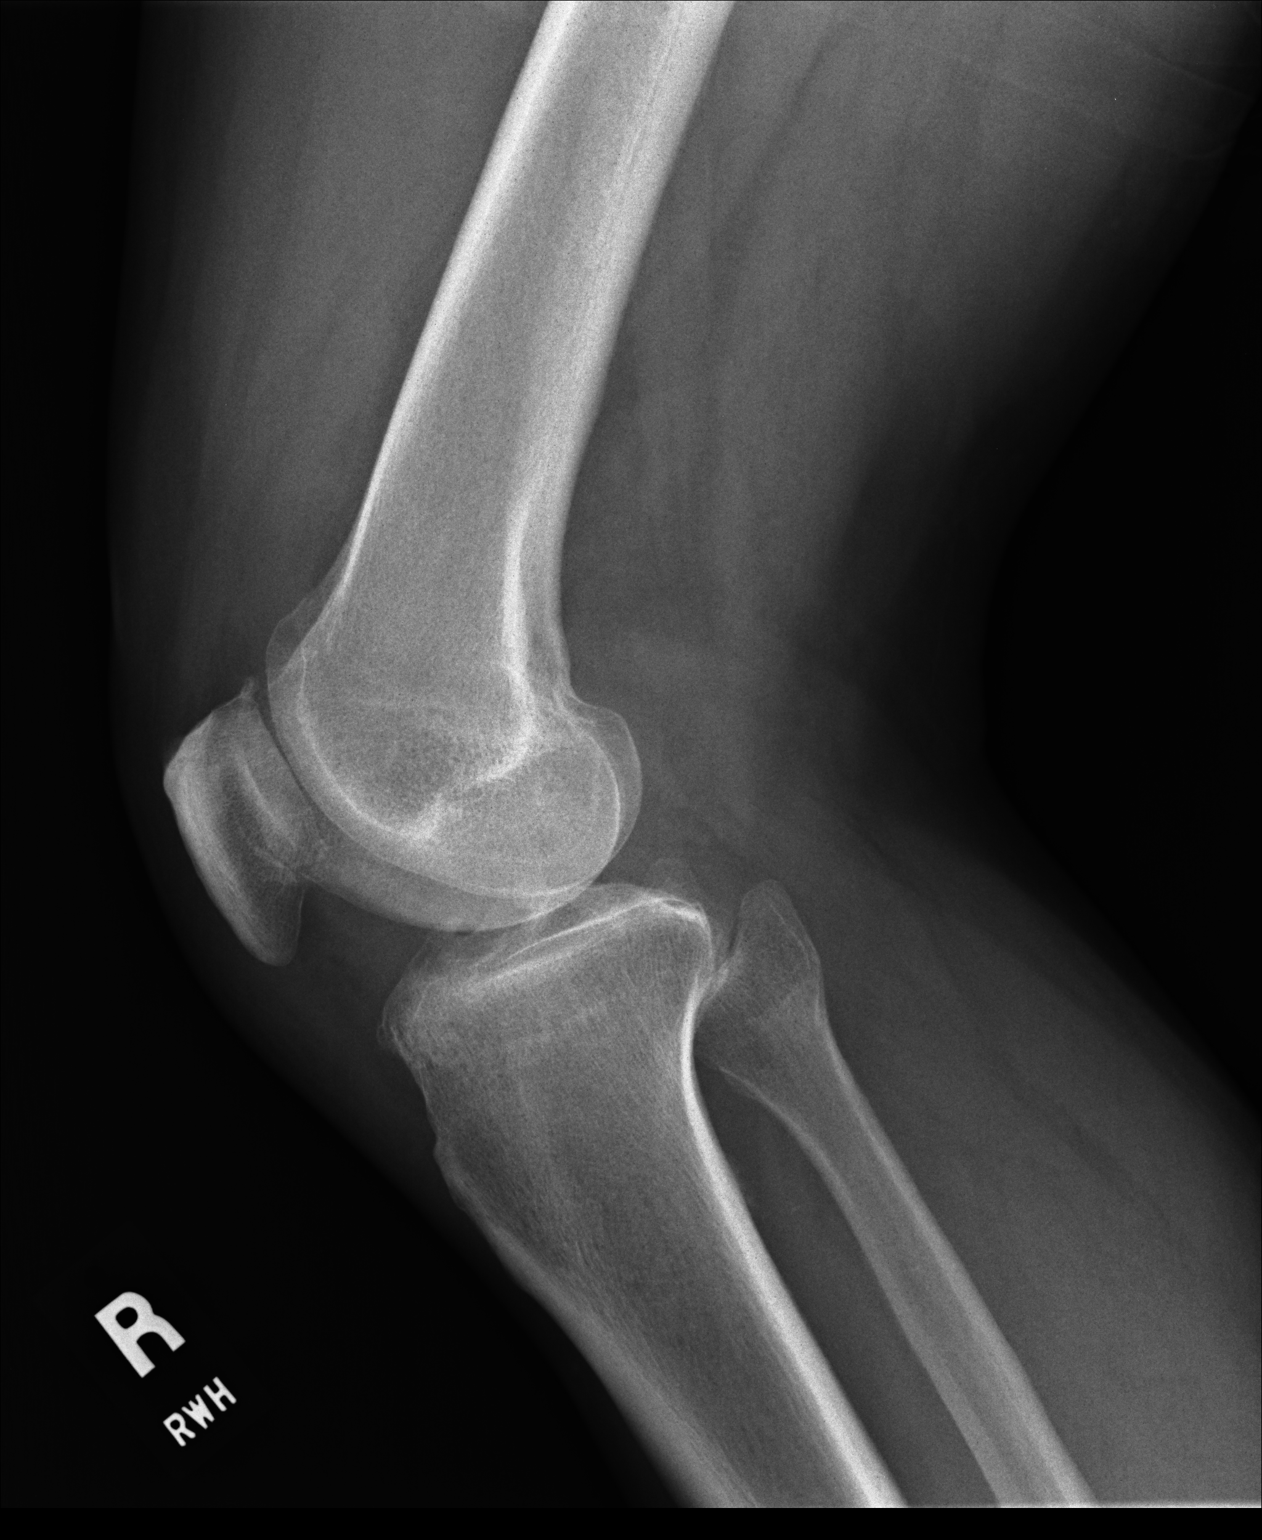

[2 of 2 positions shown; findings below may reference images not displayed]

FINDINGS: No acute fracture or malalignment. Small suprapatellar joint
effusion. Mild to moderate tricompartmental osteoarthritis with
narrowing of the medial compartment, peaking of the tibial spines
and early osteophyte formation. Bony mineralization is within normal
limits.
IMPRESSION: Mild to moderate tricompartmental osteoarthritis most significant in
the medial compartment where there is joint space narrowing.

Small, likely degenerative knee joint effusion.

## 2020-07-20 ENCOUNTER — Other Ambulatory Visit (HOSPITAL_BASED_OUTPATIENT_CLINIC_OR_DEPARTMENT_OTHER): Payer: Self-pay | Admitting: Internal Medicine

## 2020-07-20 ENCOUNTER — Ambulatory Visit: Payer: Self-pay | Attending: Internal Medicine

## 2020-07-20 DIAGNOSIS — Z23 Encounter for immunization: Secondary | ICD-10-CM

## 2020-07-20 MED FILL — PFIZER-BIONTECH COVID-19 VA: 30 | 21 days supply | Qty: 0 | Fill #0

## 2020-07-20 NOTE — Progress Notes (Signed)
   Covid-19 Vaccination Clinic  Name:  PILOT PRINDLE    MRN: [id number] DOB: [birth year]  07/20/2020  Mr. Almanza was observed post Covid-19 immunization for 15 minutes without incident. He was provided with Vaccine Information Sheet and instruction to access the V-Safe system.   Mr. Milliman was instructed to call 911 with any severe reactions post vaccine: Marland Kitchen Difficulty breathing  . Swelling of face and throat  . A fast heartbeat  . A bad rash all over body  . Dizziness and weakness   Immunizations Administered    Name Date Dose VIS Date Route   Pfizer COVID-19 Vaccine 07/20/2020 11:45 AM 0.3 mL 04/18/2020 Intramuscular   Manufacturer: [employer], [employer]   Lot: [id number]   NDC: [address]-8
